# Patient Record
Sex: Male | Born: 1965 | Race: White | Hispanic: No | Marital: Married | Smoking: Current every day smoker
Health system: Southern US, Community
[De-identification: ages and names within clinical notes are randomized; demographics above are authoritative.]

## PROBLEM LIST (undated history)

## (undated) DIAGNOSIS — J449 Chronic obstructive pulmonary disease, unspecified: Secondary | ICD-10-CM

## (undated) DIAGNOSIS — A630 Anogenital (venereal) warts: Secondary | ICD-10-CM

## (undated) DIAGNOSIS — M1990 Unspecified osteoarthritis, unspecified site: Secondary | ICD-10-CM

## (undated) DIAGNOSIS — N401 Enlarged prostate with lower urinary tract symptoms: Secondary | ICD-10-CM

## (undated) DIAGNOSIS — Z8719 Personal history of other diseases of the digestive system: Secondary | ICD-10-CM

## (undated) DIAGNOSIS — J410 Simple chronic bronchitis: Secondary | ICD-10-CM

## (undated) DIAGNOSIS — G540 Brachial plexus disorders: Secondary | ICD-10-CM

## (undated) DIAGNOSIS — K08109 Complete loss of teeth, unspecified cause, unspecified class: Secondary | ICD-10-CM

## (undated) DIAGNOSIS — G8929 Other chronic pain: Secondary | ICD-10-CM

---

## 1988-12-26 HISTORY — PX: APPENDECTOMY: SHX54

## 1995-12-27 HISTORY — PX: CLOSED REDUCTION FINGER WITH PERCUTANEOUS PINNING: SHX5612

## 1998-12-26 HISTORY — PX: MANDIBLE FRACTURE SURGERY: SHX706

## 2005-01-29 ENCOUNTER — Emergency Department: Payer: Self-pay | Admitting: Emergency Medicine

## 2009-04-22 ENCOUNTER — Emergency Department: Payer: Self-pay | Admitting: Internal Medicine

## 2009-04-28 ENCOUNTER — Ambulatory Visit: Payer: Self-pay | Admitting: Internal Medicine

## 2010-07-06 ENCOUNTER — Emergency Department: Payer: Self-pay | Admitting: Emergency Medicine

## 2011-12-27 DIAGNOSIS — Z87898 Personal history of other specified conditions: Secondary | ICD-10-CM

## 2011-12-27 HISTORY — DX: Personal history of other specified conditions: Z87.898

## 2012-01-04 ENCOUNTER — Ambulatory Visit: Payer: Self-pay | Admitting: Internal Medicine

## 2013-01-06 ENCOUNTER — Emergency Department: Payer: Self-pay | Admitting: Emergency Medicine

## 2013-01-06 LAB — DRUG SCREEN, URINE
Benzodiazepine, Ur Scrn: NEGATIVE (ref ?–200)
Cannabinoid 50 Ng, Ur ~~LOC~~: NEGATIVE (ref ?–50)
Cocaine Metabolite,Ur ~~LOC~~: NEGATIVE (ref ?–300)
MDMA (Ecstasy)Ur Screen: NEGATIVE (ref ?–500)
Opiate, Ur Screen: NEGATIVE (ref ?–300)
Phencyclidine (PCP) Ur S: NEGATIVE (ref ?–25)

## 2013-01-06 LAB — CBC WITH DIFFERENTIAL/PLATELET
Basophil #: 0 10*3/uL (ref 0.0–0.1)
Basophil %: 0.5 %
HGB: 16.2 g/dL (ref 13.0–18.0)
Lymphocyte #: 3.2 10*3/uL (ref 1.0–3.6)
Lymphocyte %: 29.1 %
MCH: 34.1 pg — ABNORMAL HIGH (ref 26.0–34.0)
MCHC: 34.6 g/dL (ref 32.0–36.0)
MCV: 99 fL (ref 80–100)
Monocyte #: 0.7 x10 3/mm (ref 0.2–1.0)
Neutrophil #: 6.5 10*3/uL (ref 1.4–6.5)
Platelet: 274 10*3/uL (ref 150–440)
RDW: 13.4 % (ref 11.5–14.5)

## 2013-01-06 LAB — CK TOTAL AND CKMB (NOT AT ARMC)
CK, Total: 87 U/L (ref 35–232)
CK-MB: <0.5 ng/mL — ABNORMAL LOW (ref 0.5–3.6)

## 2013-01-06 LAB — URINALYSIS, COMPLETE
Bilirubin,UR: NEGATIVE
Blood: NEGATIVE
Leukocyte Esterase: NEGATIVE
Nitrite: NEGATIVE
Ph: 5 (ref 4.5–8.0)
RBC,UR: <1 /HPF (ref 0–5)
Specific Gravity: 1.002 (ref 1.003–1.030)

## 2013-01-06 LAB — COMPREHENSIVE METABOLIC PANEL
Alkaline Phosphatase: 126 U/L (ref 50–136)
Anion Gap: 9 (ref 7–16)
Bilirubin,Total: 0.2 mg/dL (ref 0.2–1.0)
Calcium, Total: 9.1 mg/dL (ref 8.5–10.1)
Co2: 23 mmol/L (ref 21–32)
Creatinine: 0.93 mg/dL (ref 0.60–1.30)
EGFR (Non-African Amer.): >60
Potassium: 3.9 mmol/L (ref 3.5–5.1)
SGOT(AST): 23 U/L (ref 15–37)
SGPT (ALT): 29 U/L (ref 12–78)
Total Protein: 7.7 g/dL (ref 6.4–8.2)

## 2013-01-06 LAB — TROPONIN I: Troponin-I: <0.02 ng/mL

## 2013-10-22 HISTORY — PX: LOOP RECORDER IMPLANT: SHX5954

## 2014-01-20 HISTORY — PX: CARDIAC ELECTROPHYSIOLOGY STUDY & DFT: SHX1293

## 2014-10-28 IMAGING — CT CT HEAD WITHOUT CONTRAST
1 series · 15 of 30 positions shown, 19 images · non-contrast
Comparison: none

REASON FOR EXAM: headache
COMMENTS:

[Series 2: soft tissue · axial · 0.44mm/px · z∈[-160,-20]mm · 15 of 32 slices shown, 19 images]
[im 2/32  brain]
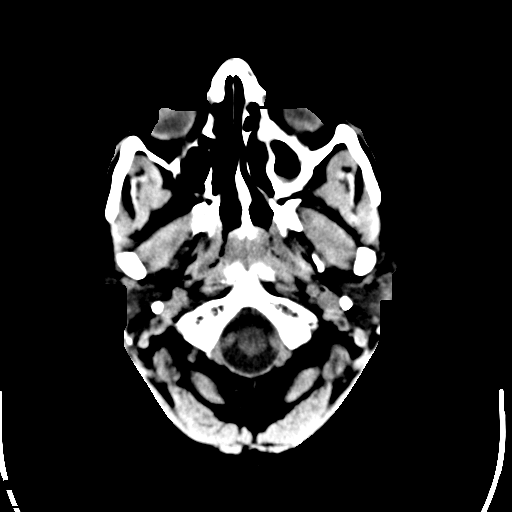
[im 2/32  bone]
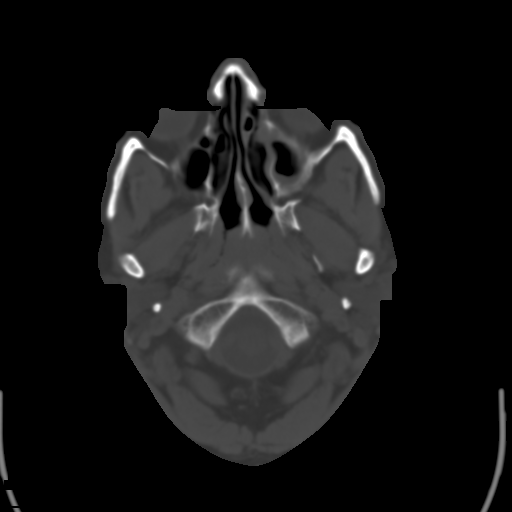
[im 4/32  brain]
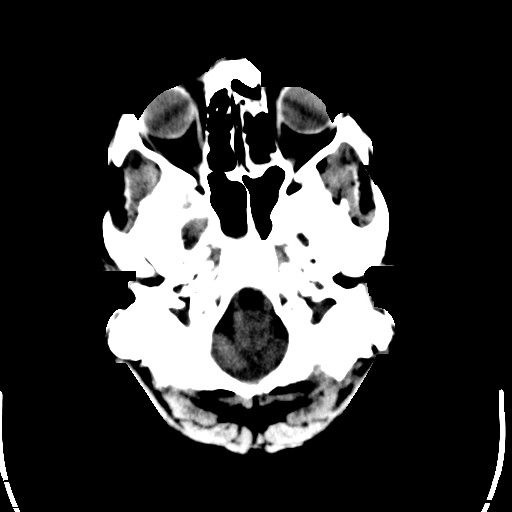
[im 6/32  brain]
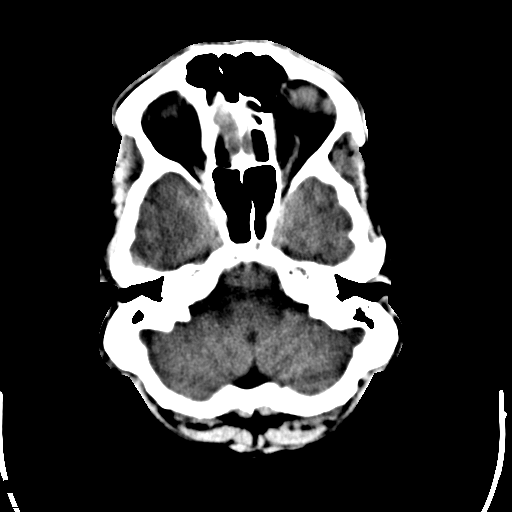
[im 8/32  brain]
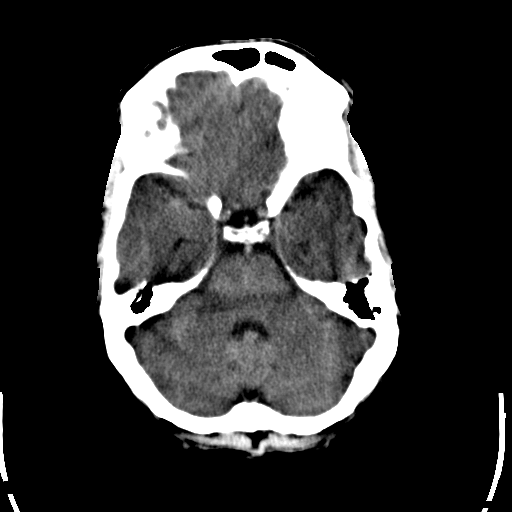
[im 10/32  brain]
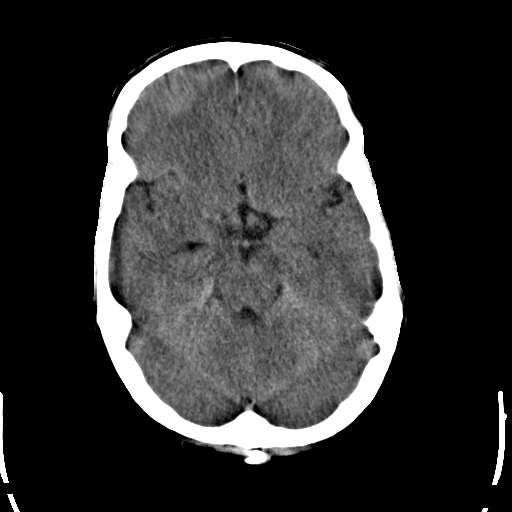
[im 10/32  bone]
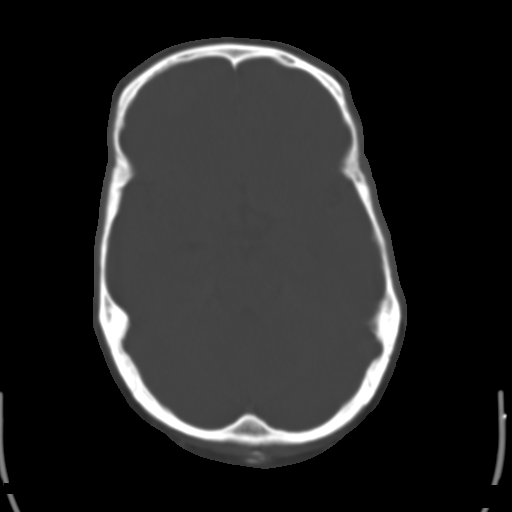
[im 12/32  brain]
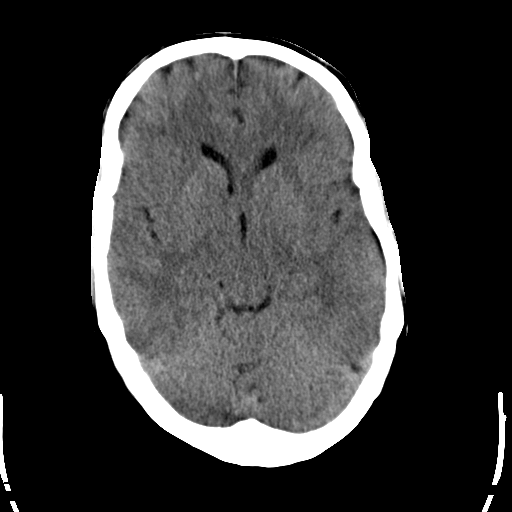
[im 14/32  brain]
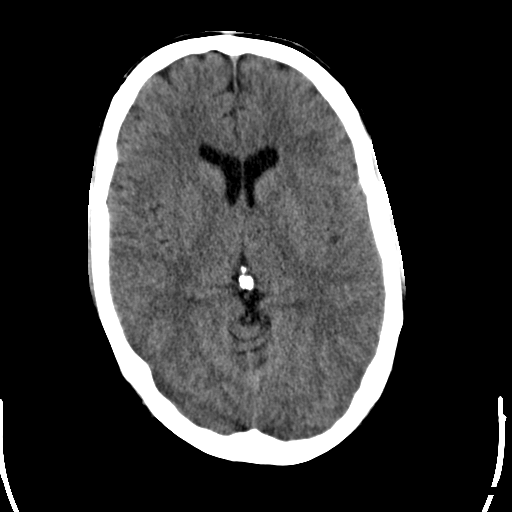
[im 17/32  brain]
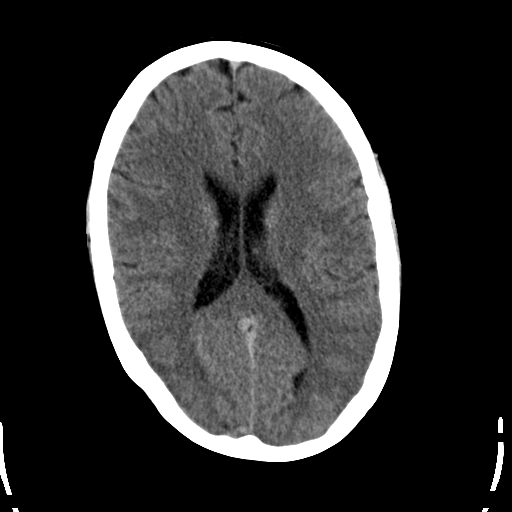
[im 18/32  brain]
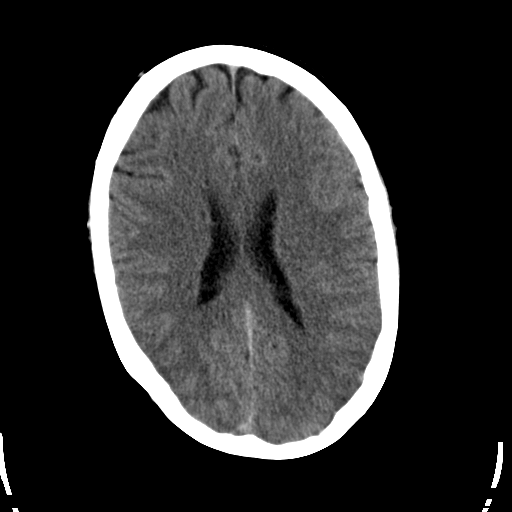
[im 18/32  bone]
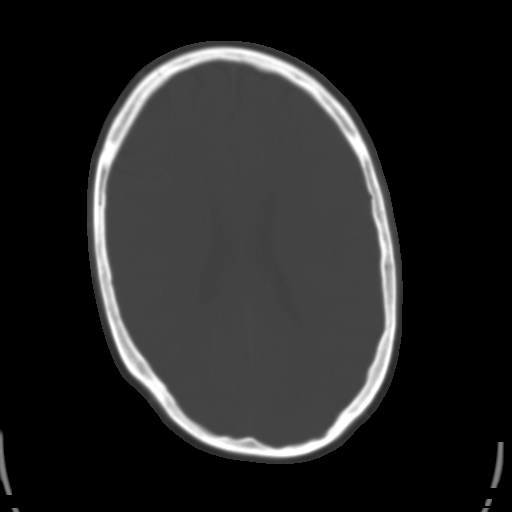
[im 20/32  brain]
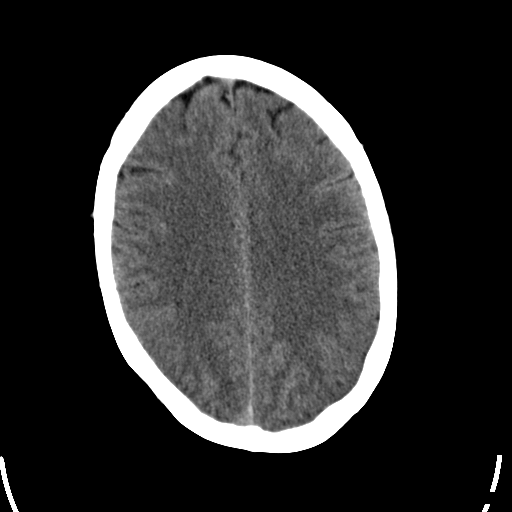
[im 22/32  brain]
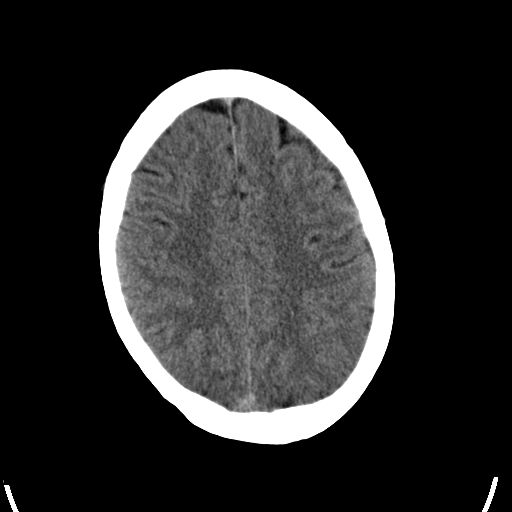
[im 24/32  brain]
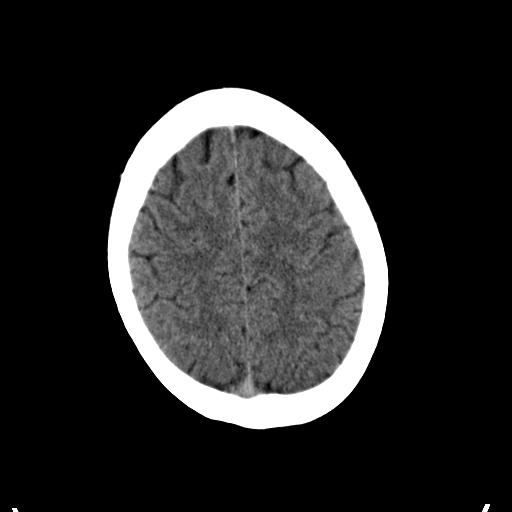
[im 26/32  brain]
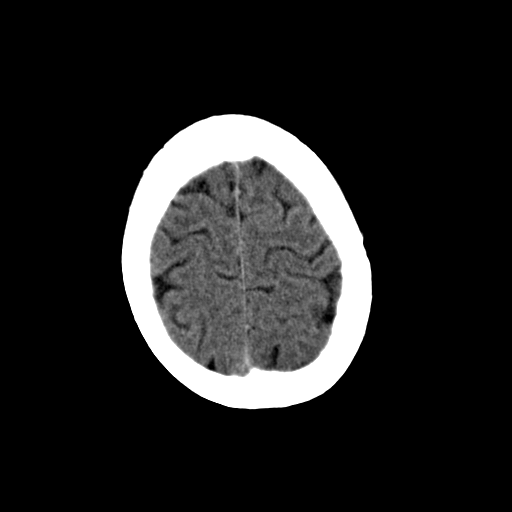
[im 26/32  bone]
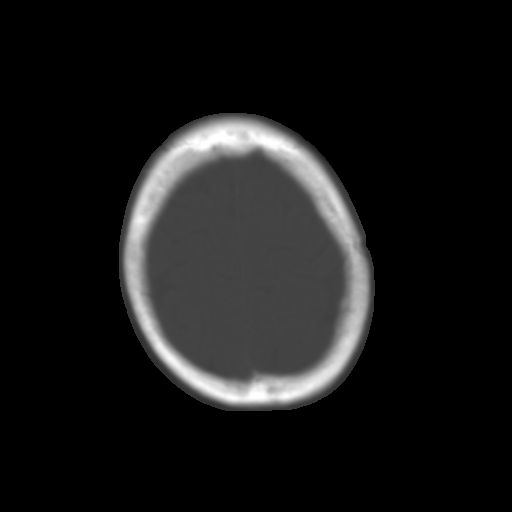
[im 28/32  brain]
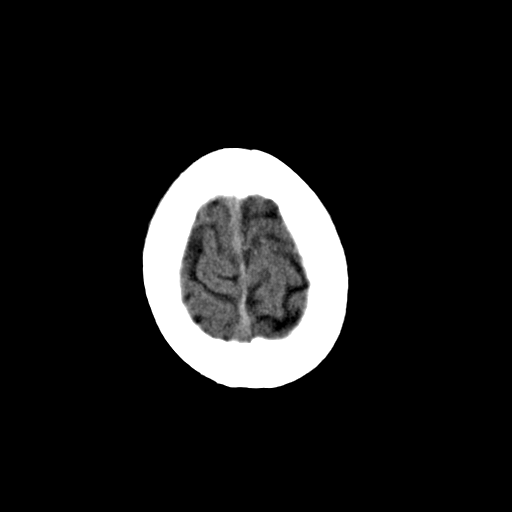
[im 30/32  brain]
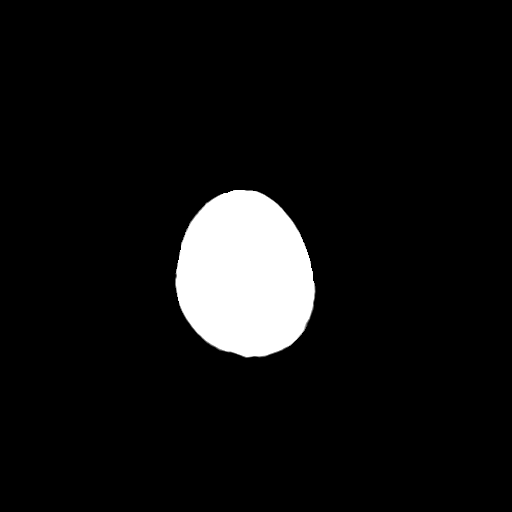

[15 of 30 positions shown; findings below may reference images not displayed]

PROCEDURE:     CT  - CT HEAD WITHOUT CONTRAST  - January 06, 2013  [DATE]

RESULT:     Axial noncontrast CT scanning was performed through the brain
with reconstructions at 5 mm intervals and slice thicknesses.

The ventricles are normal in size and position. There is no intracranial
hemorrhage nor intracranial mass effect. The cerebellum and brainstem are
normal in density. There is no evidence of an evolving ischemic infarction.
At bone window settings there is an air-fluid level in the left maxillary
sinus. The patient appears of undergone previous left maxillary antrostomy.
There is a trace of fluid in age sphenoid sinus on the right. There is
mucoperiosteal thickening within the ethmoid sinuses. The mastoid air cells
appear hypoplastic or chronically fluid-filled.
IMPRESSION: 1. There is no acute abnormality of the brain.
2. There are inflammatory changes of the left maxillary and right sphenoid
sinus cells with fluid. There is mucoperiosteal thickening in the ethmoid
sinuses.

A preliminary report was sent to the [HOSPITAL] the conclusion
of the study.

[REDACTED]

## 2015-02-22 ENCOUNTER — Emergency Department: Payer: Self-pay | Admitting: Emergency Medicine

## 2015-04-07 ENCOUNTER — Ambulatory Visit
Admit: 2015-04-07 | Disposition: A | Discharge status: Home / Self Care | Payer: Self-pay | Attending: Unknown Physician Specialty | Admitting: Unknown Physician Specialty

## 2018-09-20 ENCOUNTER — Emergency Department: Payer: BLUE CROSS/BLUE SHIELD

## 2018-09-20 ENCOUNTER — Other Ambulatory Visit: Payer: Self-pay

## 2018-09-20 ENCOUNTER — Encounter: Payer: Self-pay | Admitting: Emergency Medicine

## 2018-09-20 ENCOUNTER — Emergency Department
Admission: EM | Admit: 2018-09-20 | Discharge: 2018-09-20 | Disposition: A | Discharge status: Home / Self Care | Payer: BLUE CROSS/BLUE SHIELD | Attending: Emergency Medicine | Admitting: Emergency Medicine

## 2018-09-20 DIAGNOSIS — S46912A Strain of unspecified muscle, fascia and tendon at shoulder and upper arm level, left arm, initial encounter: Secondary | ICD-10-CM | POA: Insufficient documentation

## 2018-09-20 DIAGNOSIS — Y93I9 Activity, other involving external motion: Secondary | ICD-10-CM | POA: Insufficient documentation

## 2018-09-20 DIAGNOSIS — Y92410 Unspecified street and highway as the place of occurrence of the external cause: Secondary | ICD-10-CM | POA: Insufficient documentation

## 2018-09-20 DIAGNOSIS — S4992XA Unspecified injury of left shoulder and upper arm, initial encounter: Secondary | ICD-10-CM | POA: Diagnosis present

## 2018-09-20 DIAGNOSIS — Y998 Other external cause status: Secondary | ICD-10-CM | POA: Insufficient documentation

## 2018-09-20 DIAGNOSIS — F17210 Nicotine dependence, cigarettes, uncomplicated: Secondary | ICD-10-CM | POA: Insufficient documentation

## 2018-09-20 DIAGNOSIS — M7918 Myalgia, other site: Secondary | ICD-10-CM

## 2018-09-20 MED ORDER — CYCLOBENZAPRINE HCL 10 MG PO TABS: 10.0000 mg | ORAL_TABLET | Freq: Three times a day (TID) | ORAL | 0 refills | Status: AC | PRN

## 2018-09-20 MED ORDER — HYDROCODONE-ACETAMINOPHEN 5-325 MG PO TABS: 1.0000 | ORAL_TABLET | Freq: Three times a day (TID) | ORAL | 0 refills | Status: AC | PRN

## 2018-09-20 MED ORDER — IBUPROFEN 800 MG PO TABS: 800.0000 mg | ORAL_TABLET | Freq: Three times a day (TID) | ORAL | 0 refills | Status: AC | PRN

## 2018-09-20 MED ORDER — CYCLOBENZAPRINE HCL 10 MG PO TABS
10.0000 mg | ORAL_TABLET | Freq: Once | ORAL | Status: AC
  Administered 2018-09-20: 10 mg via ORAL
  Filled 2018-09-20: qty 1

## 2018-09-20 MED ORDER — IBUPROFEN 800 MG PO TABS
800.0000 mg | ORAL_TABLET | Freq: Once | ORAL | Status: AC
  Administered 2018-09-20: 800 mg via ORAL
  Filled 2018-09-20: qty 1

## 2018-09-20 MED ORDER — HYDROCODONE-ACETAMINOPHEN 5-325 MG PO TABS
1.0000 | ORAL_TABLET | Freq: Once | ORAL | Status: AC
  Administered 2018-09-20: 1 via ORAL
  Filled 2018-09-20: qty 1
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
## 2018-10-23 HISTORY — PX: SHOULDER ARTHROSCOPY W/ LABRAL REPAIR: SHX2399
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
## 2021-02-26 ENCOUNTER — Other Ambulatory Visit: Payer: Self-pay | Admitting: Physician Assistant

## 2021-02-26 DIAGNOSIS — M25512 Pain in left shoulder: Secondary | ICD-10-CM

## 2021-11-16 HISTORY — PX: LOOP RECORDER REMOVAL: EP1215

## 2022-02-22 ENCOUNTER — Other Ambulatory Visit: Payer: Self-pay | Admitting: Physician Assistant

## 2022-02-22 DIAGNOSIS — M25512 Pain in left shoulder: Secondary | ICD-10-CM

## 2022-02-25 ENCOUNTER — Other Ambulatory Visit: Payer: Self-pay | Admitting: Urology

## 2022-04-11 ENCOUNTER — Encounter (HOSPITAL_BASED_OUTPATIENT_CLINIC_OR_DEPARTMENT_OTHER): Payer: Self-pay | Admitting: Urology

## 2022-04-11 NOTE — Progress Notes (Signed)
Spoke w/ via phone for pre-op interview--- pt ?Lab needs dos----   no            ?Lab results------ current ekg in care everywhere dated 11-11-2022, not 12 lead tracing ?COVID test -----patient states asymptomatic no test needed ?Arrive at ------- 1215 on 04-14-2022 ?NPO after MN NO Solid Food.  Clear liquids from MN until--- 1115 ?Med rec completed ?Medications to take morning of surgery ----- none ?Diabetic medication ----- n/a ?Patient instructed no nail polish to be worn day of surgery ?Patient instructed to bring photo id and insurance card day of surgery ?Patient aware to have Driver (ride ) / caregiver for 24 hours after surgery --- wife, Revonda Standard ?Patient Special Instructions ----- n/a ?Pre-Op special Istructions ----- no bp/ iv in left arm due to thoracic outlet syndrome , scheduled for surgery 05-13-2022 ?Patient verbalized understanding of instructions that were given at this phone interview. ?Patient denies shortness of breath, chest pain, fever, cough at this phone interview.  ?

## 2022-04-14 ENCOUNTER — Other Ambulatory Visit: Payer: Self-pay

## 2022-04-14 ENCOUNTER — Ambulatory Visit (HOSPITAL_BASED_OUTPATIENT_CLINIC_OR_DEPARTMENT_OTHER): Payer: 59 | Admitting: Certified Registered Nurse Anesthetist

## 2022-04-14 ENCOUNTER — Ambulatory Visit (HOSPITAL_BASED_OUTPATIENT_CLINIC_OR_DEPARTMENT_OTHER)
Admission: RE | Admit: 2022-04-14 | Discharge: 2022-04-14 | Disposition: A | Discharge status: Home / Self Care | Payer: 59 | Attending: Urology | Admitting: Urology

## 2022-04-14 ENCOUNTER — Encounter (HOSPITAL_BASED_OUTPATIENT_CLINIC_OR_DEPARTMENT_OTHER)
Admission: RE | Disposition: A | Discharge status: Home / Self Care | Payer: Self-pay | Source: Home / Self Care | Attending: Urology

## 2022-04-14 ENCOUNTER — Encounter (HOSPITAL_BASED_OUTPATIENT_CLINIC_OR_DEPARTMENT_OTHER): Payer: Self-pay | Admitting: Urology

## 2022-04-14 DIAGNOSIS — A630 Anogenital (venereal) warts: Secondary | ICD-10-CM | POA: Diagnosis present

## 2022-04-14 DIAGNOSIS — F17210 Nicotine dependence, cigarettes, uncomplicated: Secondary | ICD-10-CM | POA: Diagnosis not present

## 2022-04-14 HISTORY — DX: Brachial plexus disorders: G54.0

## 2022-04-14 HISTORY — PX: CONDYLOMA EXCISION/FULGURATION: SHX1389

## 2022-04-14 HISTORY — DX: Personal history of other diseases of the digestive system: Z87.19

## 2022-04-14 HISTORY — DX: Anogenital (venereal) warts: A63.0

## 2022-04-14 HISTORY — DX: Other obstructive and reflux uropathy: N40.1

## 2022-04-14 HISTORY — DX: Complete loss of teeth, unspecified cause, unspecified class: K08.109

## 2022-04-14 HISTORY — DX: Simple chronic bronchitis: J41.0

## 2022-04-14 HISTORY — DX: Other chronic pain: G89.29

## 2022-04-14 HISTORY — DX: Unspecified osteoarthritis, unspecified site: M19.90

## 2022-04-14 HISTORY — DX: Chronic obstructive pulmonary disease, unspecified: J44.9

## 2022-04-14 SURGERY — REMOVAL, CONDYLOMA
Anesthesia: General | Site: Scrotum

## 2022-04-14 MED ORDER — ONDANSETRON HCL 4 MG/2ML IJ SOLN
INTRAMUSCULAR | Status: DC | PRN
  Administered 2022-04-14: 4 mg via INTRAVENOUS

## 2022-04-14 MED ORDER — LACTATED RINGERS IV SOLN: INTRAVENOUS | Status: DC

## 2022-04-14 MED ORDER — FENTANYL CITRATE (PF) 100 MCG/2ML IJ SOLN
INTRAMUSCULAR | Status: AC
  Filled 2022-04-14: qty 2

## 2022-04-14 MED ORDER — PROPOFOL 10 MG/ML IV BOLUS
INTRAVENOUS | Status: AC
  Filled 2022-04-14: qty 40

## 2022-04-14 MED ORDER — MEPERIDINE HCL 25 MG/ML IJ SOLN: 6.2500 mg | INTRAMUSCULAR | Status: DC | PRN

## 2022-04-14 MED ORDER — IMIQUIMOD 5 % EX CREA: TOPICAL_CREAM | CUTANEOUS | 1 refills | Status: AC

## 2022-04-14 MED ORDER — ONDANSETRON HCL 4 MG/2ML IJ SOLN
INTRAMUSCULAR | Status: AC
  Filled 2022-04-14: qty 2

## 2022-04-14 MED ORDER — CEFAZOLIN SODIUM-DEXTROSE 2-4 GM/100ML-% IV SOLN
INTRAVENOUS | Status: AC
Start: 2022-04-14 — End: ?
  Filled 2022-04-14: qty 100

## 2022-04-14 MED ORDER — PROPOFOL 10 MG/ML IV BOLUS
INTRAVENOUS | Status: AC
  Filled 2022-04-14: qty 20

## 2022-04-14 MED ORDER — HYDROMORPHONE HCL 1 MG/ML IJ SOLN: 0.2500 mg | INTRAMUSCULAR | Status: DC | PRN

## 2022-04-14 MED ORDER — PROMETHAZINE HCL 25 MG/ML IJ SOLN: 6.2500 mg | INTRAMUSCULAR | Status: DC | PRN

## 2022-04-14 MED ORDER — OXYCODONE HCL 5 MG PO TABS: 5.0000 mg | ORAL_TABLET | Freq: Once | ORAL | Status: DC | PRN

## 2022-04-14 MED ORDER — FENTANYL CITRATE (PF) 100 MCG/2ML IJ SOLN
INTRAMUSCULAR | Status: DC | PRN
  Administered 2022-04-14: 100 ug via INTRAVENOUS
  Administered 2022-04-14: 50 ug via INTRAVENOUS

## 2022-04-14 MED ORDER — PROPOFOL 10 MG/ML IV BOLUS
INTRAVENOUS | Status: DC | PRN
Start: 2022-04-14 — End: 2022-04-14
  Administered 2022-04-14: 100 mg via INTRAVENOUS
  Administered 2022-04-14: 200 mg via INTRAVENOUS

## 2022-04-14 MED ORDER — SODIUM CHLORIDE 0.9 % IR SOLN
Status: DC | PRN
  Administered 2022-04-14: 500 mL

## 2022-04-14 MED ORDER — AMISULPRIDE (ANTIEMETIC) 5 MG/2ML IV SOLN: 10.0000 mg | Freq: Once | INTRAVENOUS | Status: DC | PRN

## 2022-04-14 MED ORDER — CEFAZOLIN SODIUM-DEXTROSE 2-4 GM/100ML-% IV SOLN
2.0000 g | INTRAVENOUS | Status: AC
  Administered 2022-04-14: 2 g via INTRAVENOUS

## 2022-04-14 MED ORDER — MIDAZOLAM HCL 2 MG/2ML IJ SOLN
INTRAMUSCULAR | Status: AC
  Filled 2022-04-14: qty 2

## 2022-04-14 MED ORDER — LIDOCAINE 2% (20 MG/ML) 5 ML SYRINGE
INTRAMUSCULAR | Status: DC | PRN
  Administered 2022-04-14: 60 mg via INTRAVENOUS

## 2022-04-14 MED ORDER — DEXAMETHASONE SODIUM PHOSPHATE 10 MG/ML IJ SOLN
INTRAMUSCULAR | Status: AC
  Filled 2022-04-14: qty 1

## 2022-04-14 MED ORDER — OXYCODONE HCL 5 MG/5ML PO SOLN: 5.0000 mg | Freq: Once | ORAL | Status: DC | PRN

## 2022-04-14 MED ORDER — MIDAZOLAM HCL 5 MG/5ML IJ SOLN
INTRAMUSCULAR | Status: DC | PRN
  Administered 2022-04-14: 2 mg via INTRAVENOUS

## 2022-04-14 MED ORDER — LIDOCAINE-EPINEPHRINE (PF) 1 %-1:200000 IJ SOLN
INTRAMUSCULAR | Status: DC | PRN
Start: 2022-04-14 — End: 2022-04-14
  Administered 2022-04-14: 10 mL

## 2022-04-14 SURGICAL SUPPLY — 39 items
ADH SKN CLS APL DERMABOND .7 (GAUZE/BANDAGES/DRESSINGS) ×1
BLADE SURG 15 STRL LF DISP TIS (BLADE) ×1 IMPLANT
BLADE SURG 15 STRL SS (BLADE) ×2
BNDG COHESIVE 1X5 TAN STRL LF (GAUZE/BANDAGES/DRESSINGS) ×2 IMPLANT
BNDG CONFORM 2 STRL LF (GAUZE/BANDAGES/DRESSINGS) ×2 IMPLANT
BNDG GAUZE ELAST 4 BULKY (GAUZE/BANDAGES/DRESSINGS) ×1 IMPLANT
CLOTH BEACON ORANGE TIMEOUT ST (SAFETY) ×2 IMPLANT
COVER BACK TABLE 60X90IN (DRAPES) ×2 IMPLANT
COVER MAYO STAND STRL (DRAPES) ×2 IMPLANT
DERMABOND ADVANCED (GAUZE/BANDAGES/DRESSINGS) ×1
DERMABOND ADVANCED .7 DNX12 (GAUZE/BANDAGES/DRESSINGS) IMPLANT
DRAPE LAPAROTOMY 100X72 PEDS (DRAPES) ×2 IMPLANT
ELECT NDL TIP 2.8 STRL (NEEDLE) IMPLANT
ELECT NEEDLE TIP 2.8 STRL (NEEDLE) IMPLANT
ELECT REM PT RETURN 9FT ADLT (ELECTROSURGICAL) ×2
ELECTRODE REM PT RTRN 9FT ADLT (ELECTROSURGICAL) ×1 IMPLANT
GAUZE 4X4 16PLY ~~LOC~~+RFID DBL (SPONGE) ×2 IMPLANT
GAUZE PETROLATUM 1 X8 (GAUZE/BANDAGES/DRESSINGS) IMPLANT
GLOVE BIO SURGEON STRL SZ8 (GLOVE) ×2 IMPLANT
GOWN STRL REUS W/TWL XL LVL3 (GOWN DISPOSABLE) ×4 IMPLANT
KIT TURNOVER CYSTO (KITS) ×2 IMPLANT
MANIFOLD NEPTUNE II (INSTRUMENTS) IMPLANT
NDL HYPO 25X1 1.5 SAFETY (NEEDLE) ×1 IMPLANT
NEEDLE HYPO 22GX1.5 SAFETY (NEEDLE) ×2 IMPLANT
NEEDLE HYPO 25X1 1.5 SAFETY (NEEDLE) ×2 IMPLANT
NS IRRIG 500ML POUR BTL (IV SOLUTION) ×2 IMPLANT
PACK BASIN DAY SURGERY FS (CUSTOM PROCEDURE TRAY) ×2 IMPLANT
PENCIL SMOKE EVACUATOR (MISCELLANEOUS) ×2 IMPLANT
SUPPORT SCROTAL LG STRP (MISCELLANEOUS) ×1 IMPLANT
SUT CHROMIC 4 0 RB 1X27 (SUTURE) ×4 IMPLANT
SUT CHROMIC 5 0 RB 1 27 (SUTURE) IMPLANT
SUT MNCRL AB 4-0 PS2 18 (SUTURE) ×1 IMPLANT
SUT VIC AB 3-0 SH 27 (SUTURE) ×2
SUT VIC AB 3-0 SH 27XBRD (SUTURE) IMPLANT
SYR CONTROL 10ML LL (SYRINGE) ×2 IMPLANT
TOWEL NATURAL 4PK STERILE (DISPOSABLE) ×2 IMPLANT
TOWEL OR 17X26 10 PK STRL BLUE (TOWEL DISPOSABLE) ×3 IMPLANT
TUBE CONNECTING 12X1/4 (SUCTIONS) IMPLANT
WATER STERILE IRR 500ML POUR (IV SOLUTION) ×2 IMPLANT

## 2022-04-14 NOTE — Op Note (Signed)
Preoperative diagnosis: Large genital condylomata ? ?Postoperative diagnosis: Same ? ?Principal procedure: Excision of large genital condylomata-penile lesion 3 x 6 cm, scrotal lesion 1 cm ? ?Surgeon: Retta Diones ? ?Anesthesia: General with LMA ? ?Specimen: 1.  Penile condyloma 2.  Scrotal condyloma, both to pathology ? ?Estimated blood loss: Less than 20 mL ? ?Complications: None ? ?Indications: 56 year old male presents for excision of 2 large genital condylomata.  He presented approximately 6 weeks ago urgently requesting management of these.  They have been biopsied by dermatologist in [facility].  I discussed with the patient excision of these, and the fact that the largest of these will create quite a defect and may cause some tension on the suture line at the top of the penis.  Risks and complications were also discussed and understood by the patient.  He understands and desires to proceed. ? ?Description of procedure: Patient properly identified in the holding area, taken to the operating room where general anesthetic was administered with the LMA.  He was placed in the supine position.  Genitalia and perineum were prepped, draped, proper timeout performed. ? ?Both condylomata were infiltrated with a total of 10 mL of 1% lidocaine with epinephrine.  Following this, elliptical incision was made around the base of the large penile condyloma which was located at the base of the penis superiorly on the right.  Subcutaneous tissue was dissected using electrocautery.  Immaculate hemostasis was achieved.  There was a large defect left after this.  Skin edges were undermined bilaterally using electrocautery.  This freed up enough skin for closure of the defect.  Subcutaneous deep sutures were then performed using 3-0 Vicryl placed in a simple interrupted fashion.  This adequately brought the skin edges together.  Skin edges were then sutured closed using a running 4-0 Monocryl suture.  This created a nice closure.   The left-sided scrotal lesion was also excised.  This defect was only approximately 1 cm and was closed using 3 separate Monocryl sutures in an interrupted simple fashion.  The scrotum was cleansed of the Betadine, dried, and Dermabond was placed on both suture lines.  Dry sterile fluff dressings were placed and a jockstrap was then secured.  At this point the procedure was terminated.  Sponge needle and instrument counts were correct x2.  The patient was awakened and taken to the PACU in stable condition having tolerated procedure well. ?

## 2022-04-14 NOTE — Anesthesia Procedure Notes (Signed)
Procedure Name: LMA Insertion ?Date/Time: 04/14/2022 2:59 PM ?Performed by: Bishop Limbo, CRNA ?Pre-anesthesia Checklist: Patient identified, Emergency Drugs available, Suction available and Patient being monitored ?Patient Re-evaluated:Patient Re-evaluated prior to induction ?Oxygen Delivery Method: Circle System Utilized ?Preoxygenation: Pre-oxygenation with 100% oxygen ?Induction Type: IV induction ?Ventilation: Mask ventilation without difficulty ?LMA: LMA inserted ?LMA Size: 5.0 ?Number of attempts: 1 ?Placement Confirmation: positive ETCO2 ?Tube secured with: Tape ?Dental Injury: Teeth and Oropharynx as per pre-operative assessment  ? ? ? ? ?

## 2022-04-14 NOTE — H&P (Signed)
H&P ? ?Chief Complaint: Genital lesions ? ?History of Present Illness: 56 yo male presents for excision of 2 large genital lesions--a large one at the base of the penis on the right as well as a smaller pedunculated lesion on the base of his Lt hemiscrotum. Prior biopsy done in a dermatologist's office in [facility] revealed condyloma acuminatum. ? ?Past Medical History:  ?Diagnosis Date  ? BPH with urinary obstruction   ? nocturia  ? Chronic pain of left upper extremity   ? Condyloma acuminatum in male   ? penile and scrotal  ? COPD (chronic obstructive pulmonary disease) (HCC)   ? Full dentures   ? History of diverticulitis of colon   ? History of syncope 2013  ? (04-11-2022  pt denied any syncopal episode 2017)  episodes started 2013 followed by cardiology 2014 w/ placement ILR, explanted 11-16-2021 per lov note in care everywhere 11-11-2021 never recorded sig. arrhythmia's  ? OA (osteoarthritis)   ? left shoulder  ? Smokers' cough (HCC)   ? per pt not productive  ? TOS (thoracic outlet syndrome)   ? vascular-- dr Ulice [facility]);  neurogenic left side  ,  pt scheduled for surgery 05-10-2022 TOS decompression  ? ? ?Past Surgical History:  ?Procedure Laterality Date  ? APPENDECTOMY  1990  ? CARDIAC ELECTROPHYSIOLOGY STUDY & DFT  01/20/2014  ? @[facility]  by dr ;   and  re-implanted ILR  ? CLOSED REDUCTION FINGER WITH PERCUTANEOUS PINNING Right 1997  ? fifth metacarpal fracture  ? LOOP RECORDER IMPLANT  10/22/2013  ? explanted 12/ 2014  ? LOOP RECORDER REMOVAL  11/16/2021  ? @[facility]   ? MANDIBLE FRACTURE SURGERY  2000  ? unilateral,  wiring removed  ? SHOULDER ARTHROSCOPY W/ LABRAL REPAIR Left 10/23/2018  ? @[facility]   ? ? ?Home Medications:  ?Allergies as of 04/14/2022   ?No Known Allergies ?  ? ?  ?Medication List  ?  ? ? Notice   ?Cannot display discharge medications because the patient has not yet been admitted. ?  ? ? ?Allergies: No Known Allergies ? ?History reviewed. No pertinent family history. ? ?Social  History:  reports that he has been smoking cigarettes. He has a 84.00 pack-year smoking history. He has quit using smokeless tobacco.  His smokeless tobacco use included snuff. He reports current alcohol use. He reports that he does not use drugs. ? ?ROS: ?A complete review of systems was performed.  All systems are negative except for pertinent findings as noted. ? ?Physical Exam:  ?Vital signs in last 24 hours: ?Ht 5\' 8"  (1.727 m)   Wt 78.5 kg   BMI 26.30 kg/m?  ?Constitutional:  Alert and oriented, No acute distress ?Cardiovascular: Regular rate  ?Respiratory: Normal respiratory effor ?Genitourinary: Normal male phallus, testes are descended bilaterally and non-tender and without masses, scrotum is normal in appearance without lesions or masses, perineum is normal on inspection. 2 by 6 mm condyloma at base of rt penis, 1 cm pedunculated lesion at bottom of Lt scrotum. ?Lymphatic: No lymphadenopathy ?Neurologic: Grossly intact, no focal deficits ?Psychiatric: Normal mood and affect ? ?I have reviewed prior pt notes ? ?I have reviewed notes from referring/previous physicians ? ? ?Impression/Assessment:  ?2 large genital lesions ? ?Plan:  ?Excision of large genital lesions ? ?

## 2022-04-14 NOTE — Anesthesia Postprocedure Evaluation (Signed)
Anesthesia Post Note ? ?Patient: Richard Chaney ? ?Procedure(s) Performed: CONDYLOMA REMOVAL (Scrotum) ? ?  ? ?Patient location during evaluation: PACU ?Anesthesia Type: General ?Level of consciousness: awake and alert ?Pain management: pain level controlled ?Vital Signs Assessment: post-procedure vital signs reviewed and stable ?Respiratory status: spontaneous breathing, nonlabored ventilation and respiratory function stable ?Cardiovascular status: blood pressure returned to baseline and stable ?Postop Assessment: no apparent nausea or vomiting ?Anesthetic complications: no ? ? ?No notable events documented. ? ?Last Vitals:  ?Vitals:  ? 04/14/22 1615 04/14/22 1700  ?BP: 133/86 132/79  ?Pulse: 67 66  ?Resp: 15 16  ?Temp:  (!) 36.4 ?C  ?SpO2: 94% 97%  ?  ?Last Pain:  ?Vitals:  ? 04/14/22 1700  ?TempSrc:   ?PainSc: 0-No pain  ? ? ?  ?  ?  ?  ?  ?  ? ?Lowella Curb ? ? ? ? ?

## 2022-04-14 NOTE — Transfer of Care (Signed)
Immediate Anesthesia Transfer of Care Note ? ?Patient: Richard Chaney ? ?Procedure(s) Performed: CONDYLOMA REMOVAL (Scrotum) ? ?Patient Location: PACU ? ?Anesthesia Type:General ? ?Level of Consciousness: awake, alert , oriented and patient cooperative ? ?Airway & Oxygen Therapy: Patient Spontanous Breathing ? ?Post-op Assessment: Report given to RN and Post -op Vital signs reviewed and stable ? ?Post vital signs: Reviewed and stable ? ?Last Vitals:  ?Vitals Value Taken Time  ?BP 129/80 04/14/22 1547  ?Temp    ?Pulse 80 04/14/22 1548  ?Resp 21 04/14/22 1548  ?SpO2 97 % 04/14/22 1548  ?Vitals shown include unvalidated device data. ? ?Last Pain:  ?Vitals:  ? 04/14/22 1306  ?TempSrc: Oral  ?PainSc: 8   ?   ? ?Patients Stated Pain Goal: 8 (04/14/22 1306) ? ?Complications: No notable events documented. ?

## 2022-04-14 NOTE — Interval H&P Note (Signed)
History and Physical Interval Note: ? ?04/14/2022 ?2:37 PM ? ?Richard Chaney  has presented today for surgery, with the diagnosis of LARGE PENILE /SCROTAL CONDYLOMATA.  The various methods of treatment have been discussed with the patient and family. After consideration of risks, benefits and other options for treatment, the patient has consented to  Procedure(s): ?CONDYLOMA REMOVAL (N/A) as a surgical intervention.  The patient's history has been reviewed, patient examined, no change in status, stable for surgery.  I have reviewed the patient's chart and labs.  Questions were answered to the patient's satisfaction.   ? ? ?Bertram Millard Staton Markey ? ? ?

## 2022-04-14 NOTE — Anesthesia Preprocedure Evaluation (Signed)
Anesthesia Evaluation  ?Patient identified by MRN, date of birth, ID band ?Patient awake ? ? ? ?Reviewed: ?Allergy & Precautions, NPO status , Patient's Chart, lab work & pertinent test results ? ?Airway ?Mallampati: II ? ?TM Distance: >3 FB ?Neck ROM: Full ? ? ? Dental ? ?(+) Edentulous Upper, Edentulous Lower ?  ?Pulmonary ?COPD, Current Smoker and Patient abstained from smoking.,  ?  ?Pulmonary exam normal ?breath sounds clear to auscultation ? ? ? ? ? ? Cardiovascular ?negative cardio ROS ?Normal cardiovascular exam ?Rhythm:Regular Rate:Normal ? ? ?  ?Neuro/Psych ?negative neurological ROS ? negative psych ROS  ? GI/Hepatic ?negative GI ROS, Neg liver ROS,   ?Endo/Other  ?negative endocrine ROS ? Renal/GU ?negative Renal ROS  ?negative genitourinary ?  ?Musculoskeletal ? ?(+) Arthritis , Osteoarthritis,   ? Abdominal ?  ?Peds ?negative pediatric ROS ?(+)  Hematology ?negative hematology ROS ?(+)   ?Anesthesia Other Findings ? ? Reproductive/Obstetrics ?negative OB ROS ? ?  ? ? ? ? ? ? ? ? ? ? ? ? ? ?  ?  ? ? ? ? ? ? ? ? ?Anesthesia Physical ?Anesthesia Plan ? ?ASA: 2 ? ?Anesthesia Plan: General  ? ?Post-op Pain Management:   ? ?Induction: Intravenous ? ?PONV Risk Score and Plan: 1 and Ondansetron and Treatment may vary due to age or medical condition ? ?Airway Management Planned: LMA ? ?Additional Equipment:  ? ?Intra-op Plan:  ? ?Post-operative Plan: Extubation in OR ? ?Informed Consent: I have reviewed the patients History and Physical, chart, labs and discussed the procedure including the risks, benefits and alternatives for the proposed anesthesia with the patient or authorized representative who has indicated his/her understanding and acceptance.  ? ? ? ?Dental advisory given ? ?Plan Discussed with: CRNA ? ?Anesthesia Plan Comments:   ? ? ? ? ? ? ?Anesthesia Quick Evaluation ? ?

## 2022-04-14 NOTE — Discharge Instructions (Addendum)
Keep a dry sterile dressing on tonight.  It is best to keep some sort of gauze over top of both suture areas for a few days ? ?It is okay to shower tomorrow, just dry wound areas well after showering ? ?You should not need an antibiotic to go home with.  If there are changes to the wounds, let us know prior to your follow-up ? ?Avoid intercourse for 1 month ? ?It is okay to take Tylenol or Advil for wound discomfort. ? ? ? ?Post Anesthesia Home Care Instructions ? ?Activity: ?Get plenty of rest for the remainder of the day. A responsible individual must stay with you for 24 hours following the procedure.  ?For the next 24 hours, DO NOT: ?-Drive a car ?-Advertising copywriter ?-Drink alcoholic beverages ?-Take any medication unless instructed by your physician ?-Make any legal decisions or sign important papers. ? ?Meals: ?Start with liquid foods such as gelatin or soup. Progress to regular foods as tolerated. Avoid greasy, spicy, heavy foods. If nausea and/or vomiting occur, drink only clear liquids until the nausea and/or vomiting subsides. Call your physician if vomiting continues. ? ?Special Instructions/Symptoms: ?Your throat may feel dry or sore from the anesthesia or the breathing tube placed in your throat during surgery. If this causes discomfort, gargle with warm salt water. The discomfort should disappear within 24 hours. ? ?    ?

## 2022-04-15 ENCOUNTER — Encounter (HOSPITAL_BASED_OUTPATIENT_CLINIC_OR_DEPARTMENT_OTHER): Payer: Self-pay | Admitting: Urology

## 2022-04-15 NOTE — Progress Notes (Signed)
04/15/2022 1132 ?Post-op phone call Note ? ?Phone call to pt to f/u post d/c.  Pt states he has done well since being home yesterday.  Does c/o sensitivity to bright light upon waking this morning. No c/o headache, dizziness, numbness or weakness.  States he does not have a hx of headaches or migraines, but feels like that might be the case today.  He admits to being an avid drinker of Mt. Dew and has already drank some this morning along with caffeinated coffee.  Advised to take Ibuprofen and to call MD office if additional symptoms appear or current symptoms do not improve.  Stated understanding.    ?

## 2022-04-19 LAB — SURGICAL PATHOLOGY

## 2022-08-03 ENCOUNTER — Other Ambulatory Visit: Payer: Self-pay | Admitting: Urology
